# Patient Record
Sex: Male | Born: 1995 | Race: White | Hispanic: No | Marital: Single | Smoking: Never smoker
Health system: Southern US, Community
[De-identification: ages and names within clinical notes are randomized; demographics above are authoritative.]

## PROBLEM LIST (undated history)

## (undated) DIAGNOSIS — T7840XA Allergy, unspecified, initial encounter: Secondary | ICD-10-CM

## (undated) DIAGNOSIS — G43909 Migraine, unspecified, not intractable, without status migrainosus: Secondary | ICD-10-CM

## (undated) DIAGNOSIS — J45909 Unspecified asthma, uncomplicated: Secondary | ICD-10-CM

## (undated) HISTORY — PX: CHOLECYSTECTOMY: SHX55

## (undated) HISTORY — DX: Allergy, unspecified, initial encounter: T78.40XA

## (undated) HISTORY — DX: Unspecified asthma, uncomplicated: J45.909

## (undated) HISTORY — DX: Migraine, unspecified, not intractable, without status migrainosus: G43.909

---
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
## 2015-06-14 ENCOUNTER — Ambulatory Visit: Payer: Self-pay | Admitting: Family Medicine

## 2015-11-10 IMAGING — CT CT HEAD WITHOUT CONTRAST
1 series · 16 of 30 positions shown, 20 images · non-contrast
Comparison: None.

CLINICAL DATA: Left-sided temple pain

EXAM:
CT HEAD WITHOUT CONTRAST
TECHNIQUE: Contiguous axial images were obtained from the base of the skull
through the vertex without intravenous contrast.

[Series 2: head wo · axial · 0.41mm/px · z∈[-172,-44]mm · 16 of 30 slices shown, 20 images]
[im 2/30  brain]
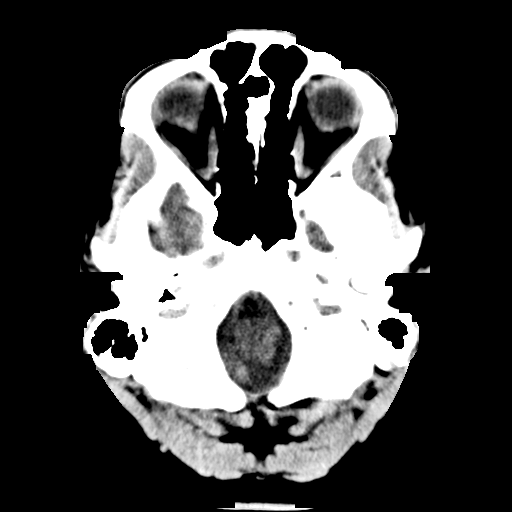
[im 2/30  bone]
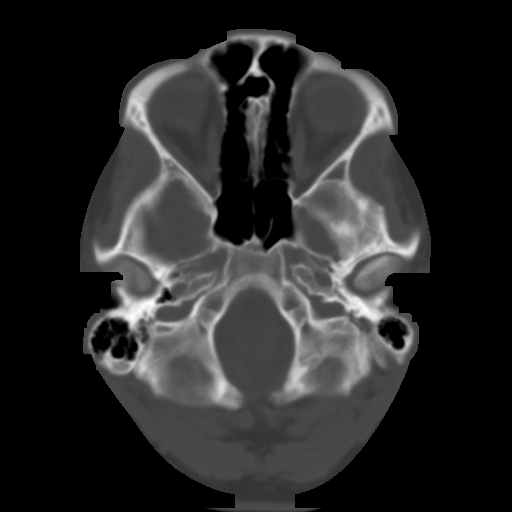
[im 4/30  brain]
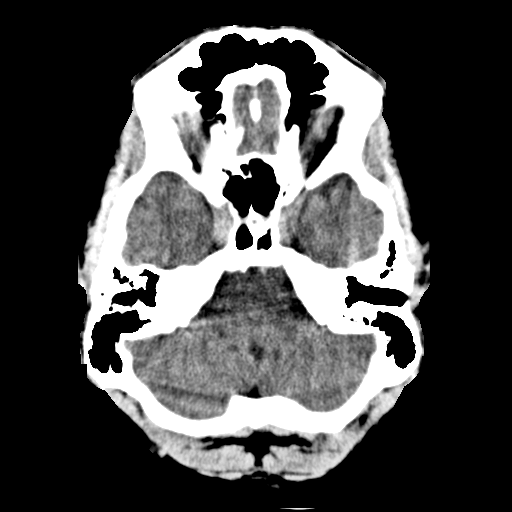
[im 6/30  brain]
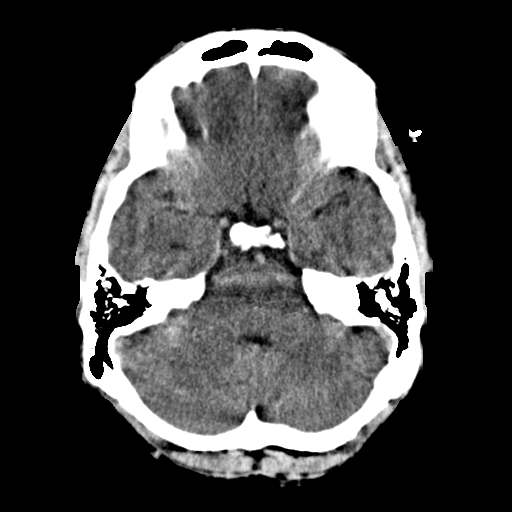
[im 8/30  brain]
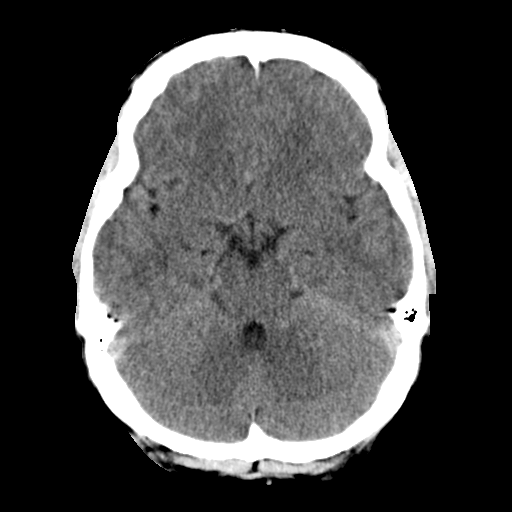
[im 9/30  brain]
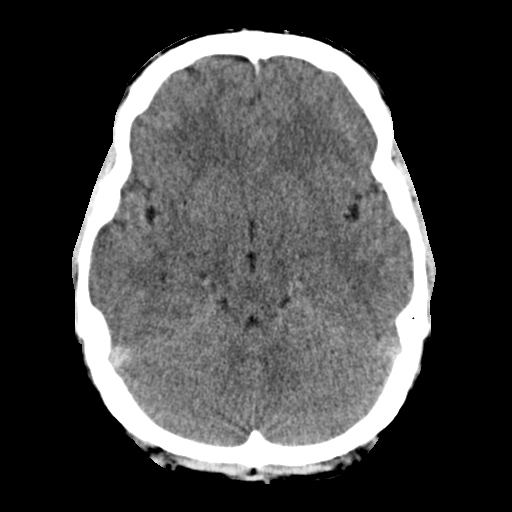
[im 9/30  bone]
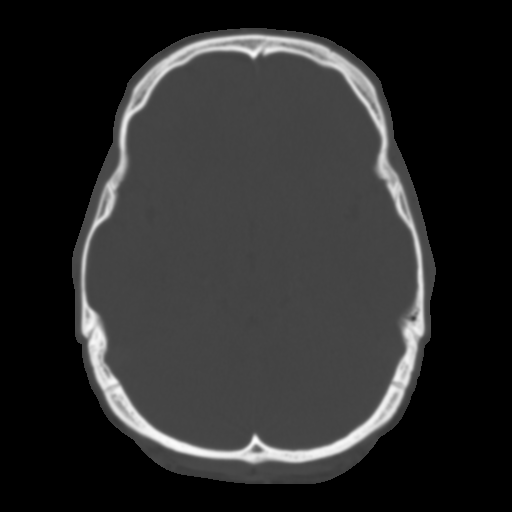
[im 11/30  brain]
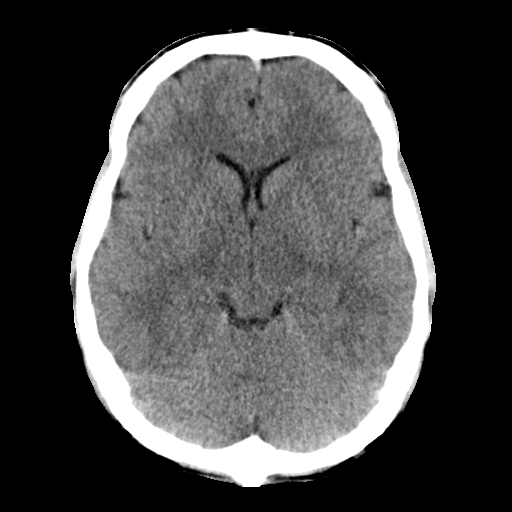
[im 13/30  brain]
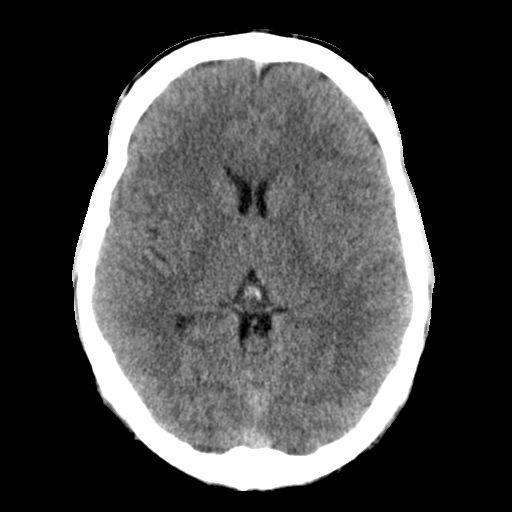
[im 15/30  brain]
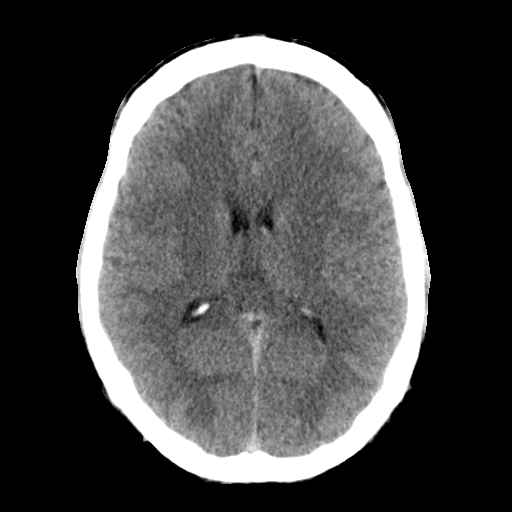
[im 16/30  brain]
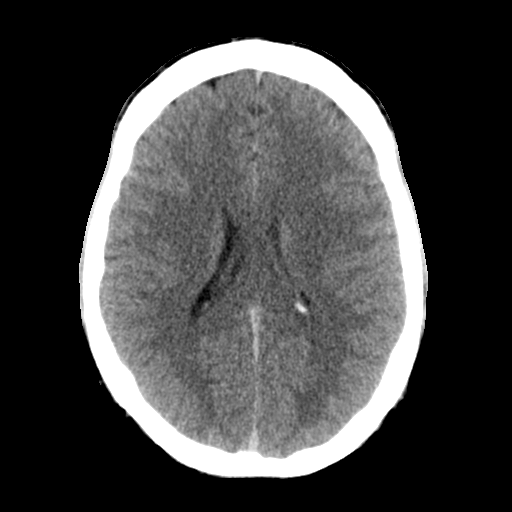
[im 16/30  bone]
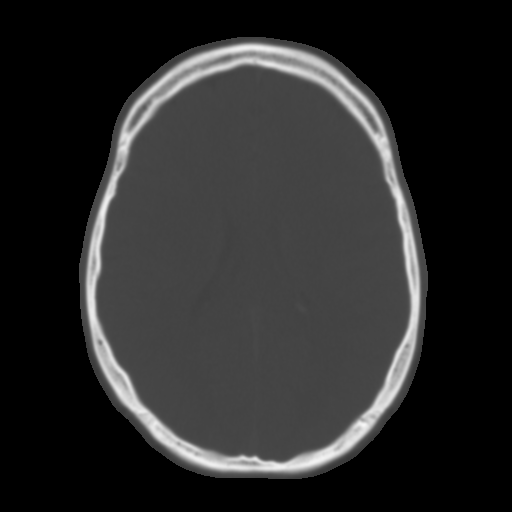
[im 18/30  brain]
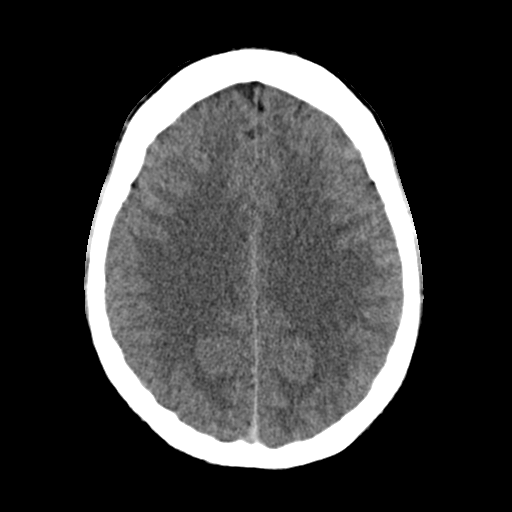
[im 20/30  brain]
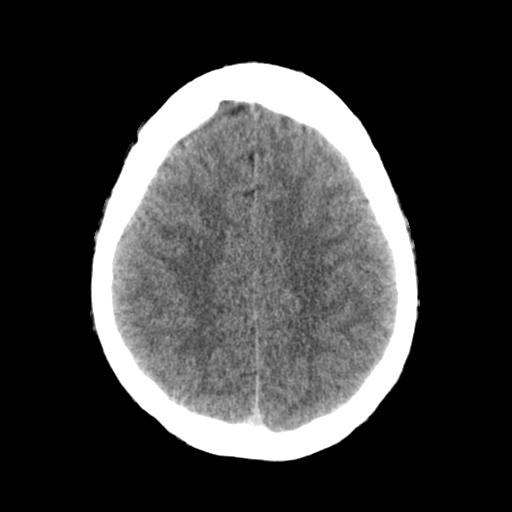
[im 22/30  brain]
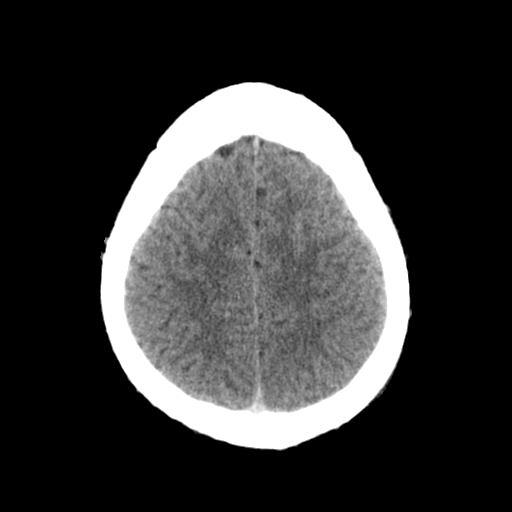
[im 23/30  brain]
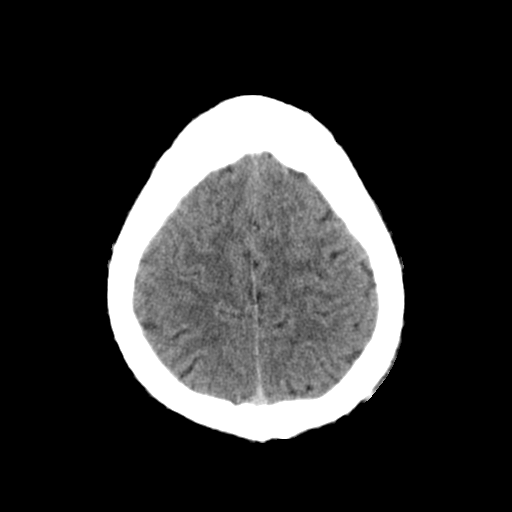
[im 23/30  bone]
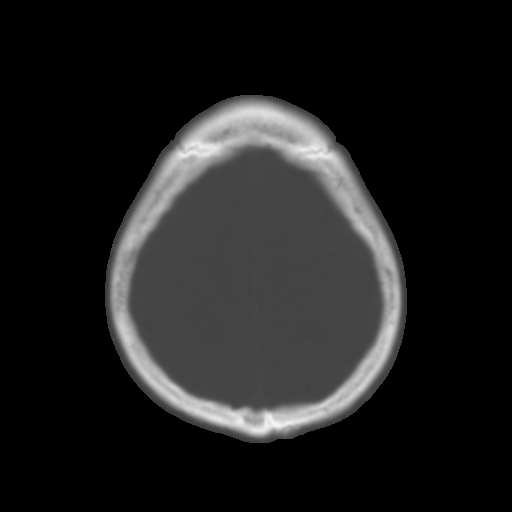
[im 25/30  brain]
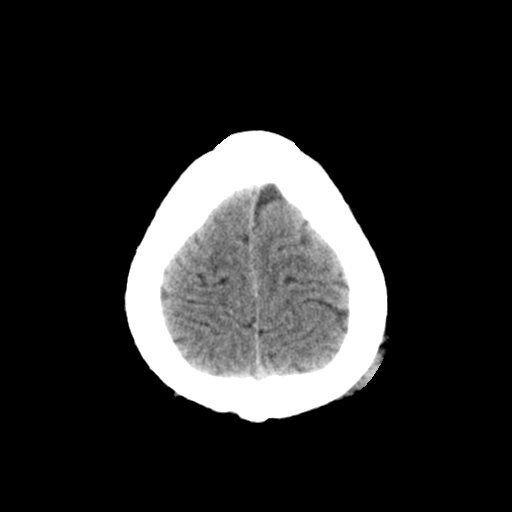
[im 27/30  brain]
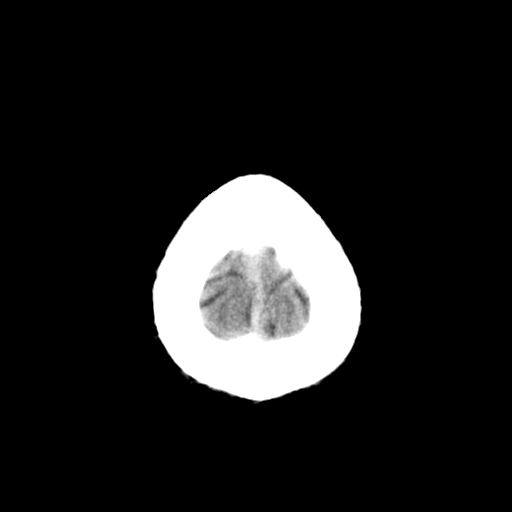
[im 29/30  brain]
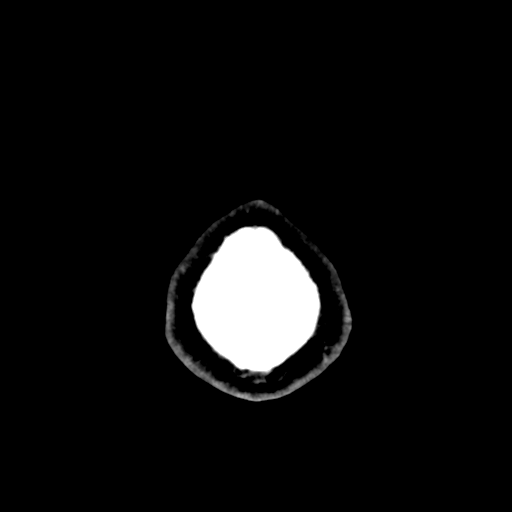

[16 of 30 positions shown; findings below may reference images not displayed]

FINDINGS: There is no evidence of mass effect, midline shift or extra-axial
fluid collections. There is no evidence of a space-occupying lesion
or intracranial hemorrhage. There is no evidence of a cortical-based
area of acute infarction.

The ventricles and sulci are appropriate for the patient's age. The
basal cisterns are patent.

Visualized portions of the orbits are unremarkable. The visualized
portions of the paranasal sinuses and mastoid air cells are
unremarkable.

The osseous structures are unremarkable. There is mild left parietal
scalp soft tissue swelling.
IMPRESSION: No acute intracranial pathology. Mild left parietal scalp soft
tissue swelling.

## 2016-06-30 IMAGING — CR RIGHT ANKLE - COMPLETE 3+ VIEW
1 series · 3 of 3 positions shown · non-contrast
Comparison: 10/07/2010

CLINICAL DATA: Twist injury today while lifting a box, severe ankle
sprain in past, having lateral ankle pain at present

EXAM:
RIGHT ANKLE - COMPLETE 3+ VIEW

[Series 1: dxr ankle right complete · 0.14mm/px · 3 of 3 slices shown]
[im 1/3]
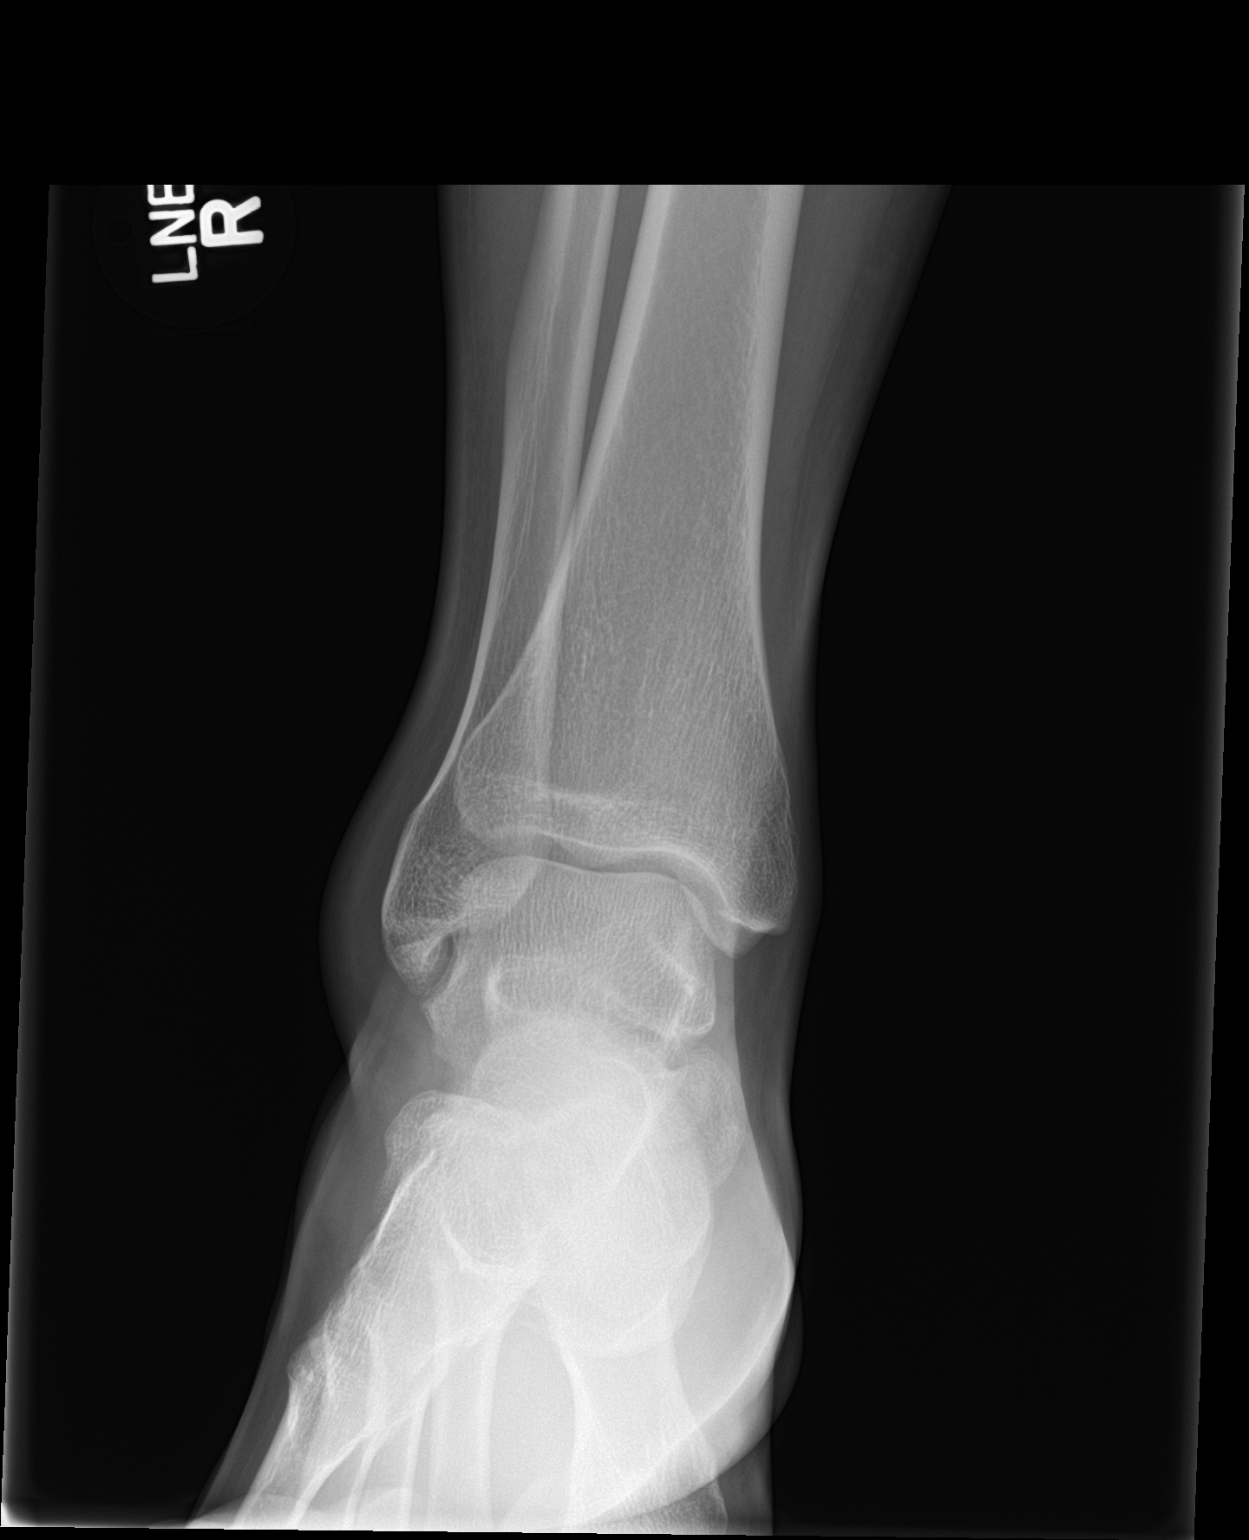
[im 2/3]
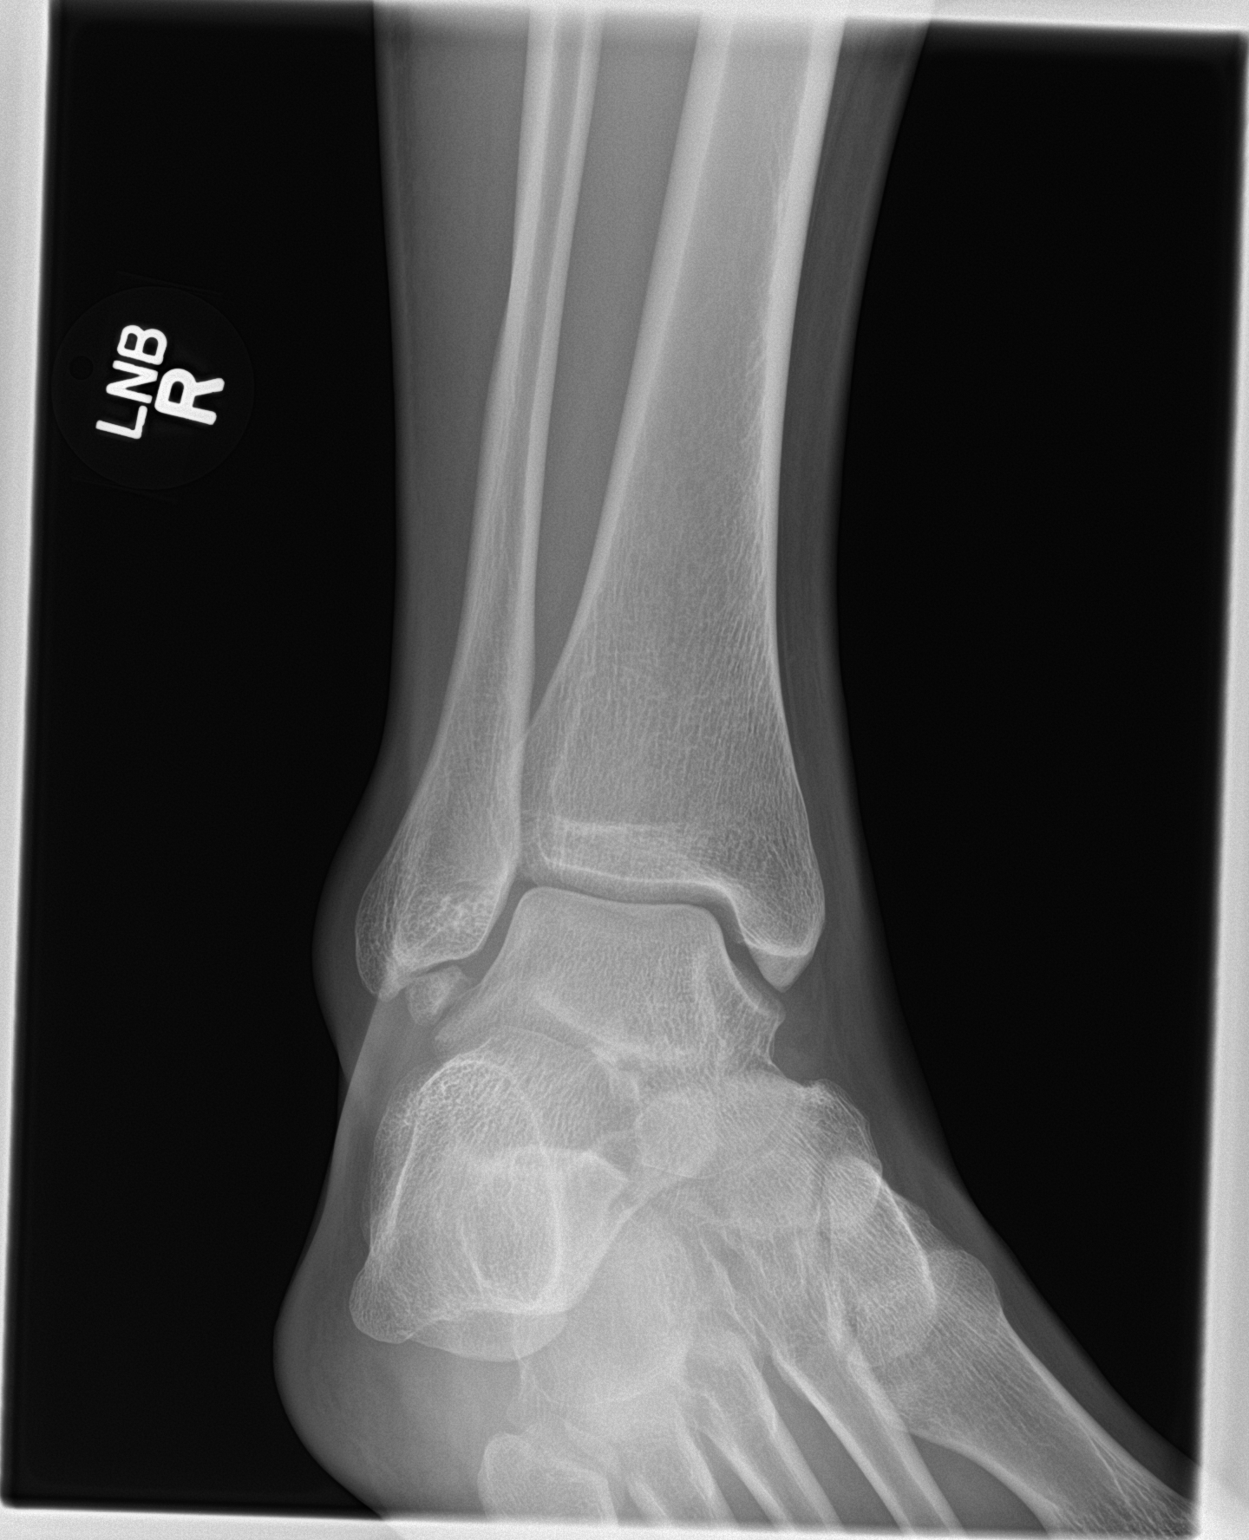
[im 3/3]
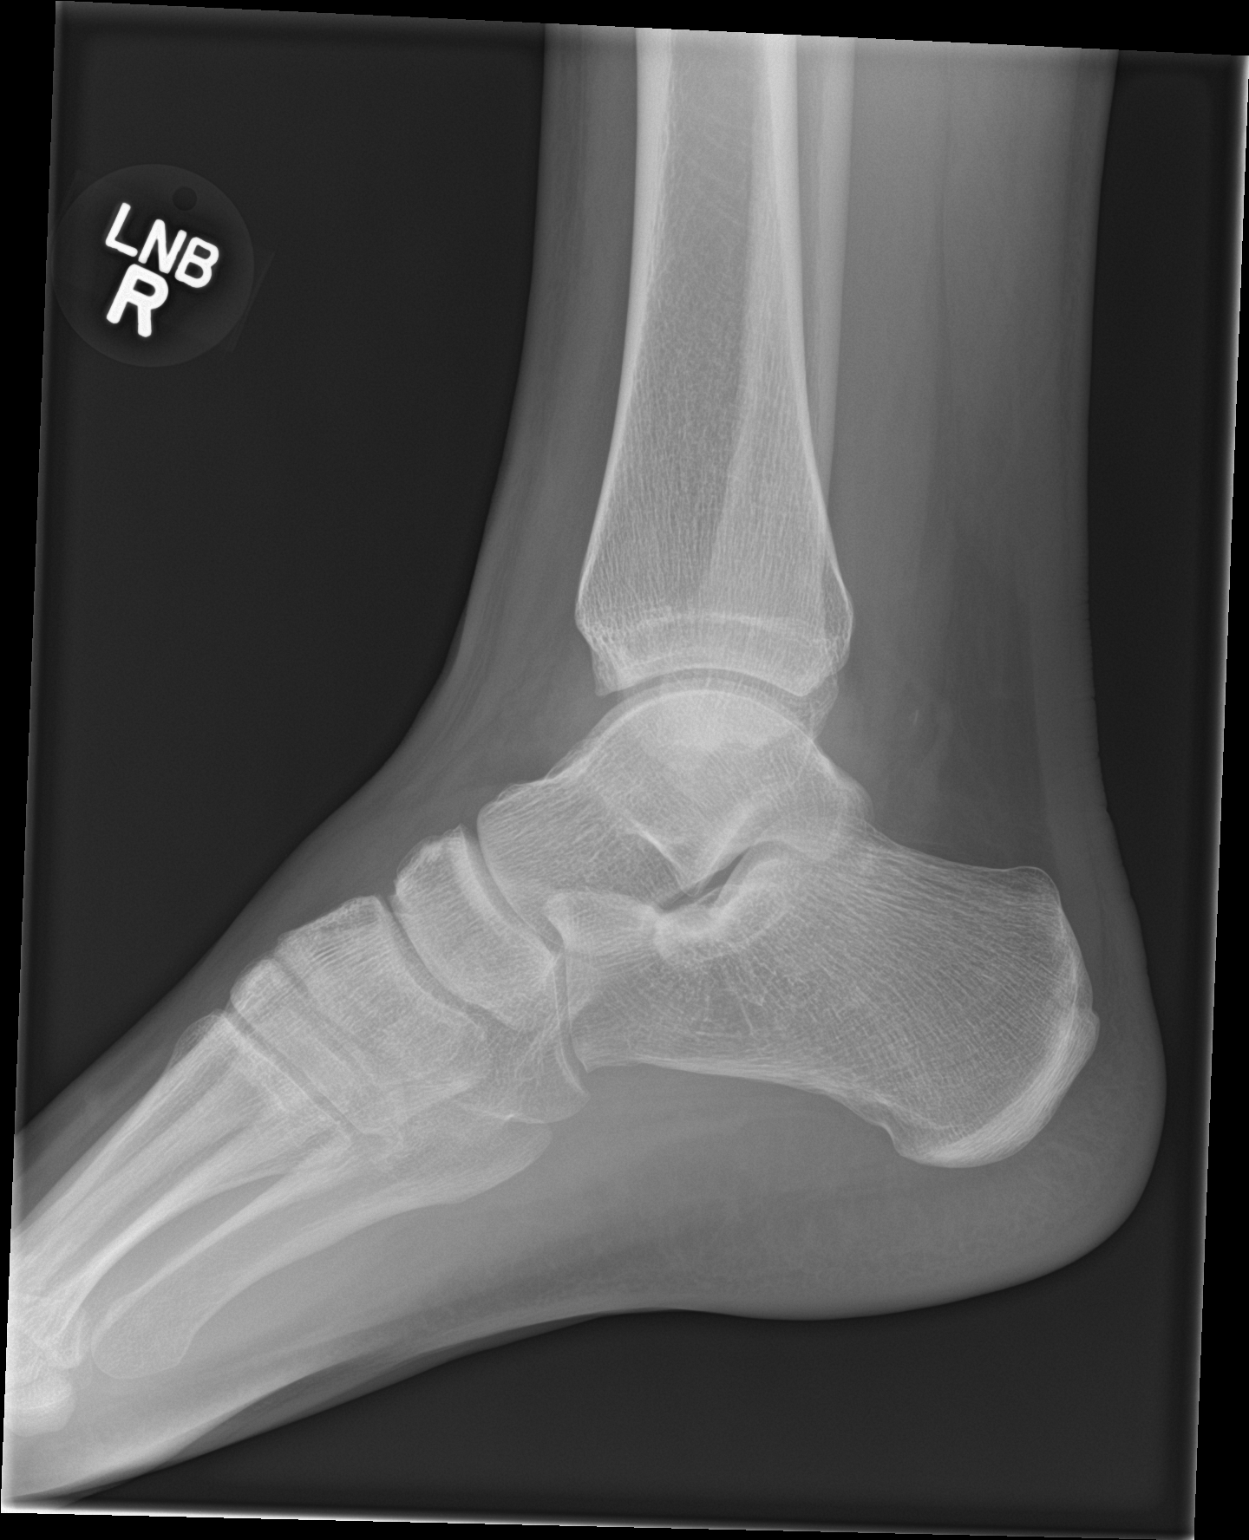

[3 of 3 positions shown; findings below may reference images not displayed]

FINDINGS: Lateral soft tissue swelling.

Ankle mortise intact.

No acute fracture, dislocation or bone destruction.

Old non fused ossicle or avulsion fragment at the lateral malleolus.

No addition loss osseous abnormalities identified.
IMPRESSION: No acute abnormalities.

## 2022-08-10 ENCOUNTER — Ambulatory Visit
Admission: EM | Admit: 2022-08-10 | Discharge: 2022-08-10 | Disposition: A | Discharge status: Home / Self Care | Payer: BC Managed Care – PPO | Attending: Emergency Medicine | Admitting: Emergency Medicine

## 2022-08-10 DIAGNOSIS — H6692 Otitis media, unspecified, left ear: Secondary | ICD-10-CM

## 2022-08-10 MED ORDER — AMOXICILLIN 875 MG PO TABS: 875.0000 mg | ORAL_TABLET | Freq: Two times a day (BID) | ORAL | 0 refills | Status: AC

## 2022-08-10 NOTE — ED Provider Notes (Signed)
Renaldo Fiddler    CSN: [id number] Arrival date & time: 08/10/22  1039      History   Chief Complaint Chief Complaint  Patient presents with   Otalgia    HPI Russell Hendrix is a 26 y.o. male.  Patient presents with 4-day history of bilateral ear pain.  He also reports 1 week history of congestion.  He took Mucinex which helped.  No OTC medications taken today.  He denies fever, ear drainage, sore throat, cough, shortness of breath, or other symptoms.  No pertinent medical history.  The history is provided by the patient and medical records.    History reviewed. No pertinent past medical history.  There are no problems to display for this patient.   Past Surgical History:  Procedure Laterality Date   CHOLECYSTECTOMY         Home Medications    Prior to Admission medications   Medication Sig Start Date End Date Taking? Authorizing Provider  amoxicillin (AMOXIL) 875 MG tablet Take 1 tablet (875 mg total) by mouth 2 (two) times daily for 10 days. 08/10/22 08/20/22 Yes Mickie Bail, NP    Family History No family history on file.  Social History Social History   Tobacco Use   Smoking status: Never   Smokeless tobacco: Never  Vaping Use   Vaping Use: Never used  Substance Use Topics   Alcohol use: Yes   Drug use: Never     Allergies   Patient has no known allergies.   Review of Systems Review of Systems  Constitutional:  Negative for chills and fever.  HENT:  Positive for congestion and ear pain. Negative for sore throat.   Respiratory:  Negative for cough and shortness of breath.   Cardiovascular:  Negative for chest pain and palpitations.  Gastrointestinal:  Negative for abdominal pain, diarrhea and vomiting.  Skin:  Negative for color change and rash.  All other systems reviewed and are negative.    Physical Exam Triage Vital Signs ED Triage Vitals  Enc Vitals Group     BP 08/10/22 1206 130/86     Pulse Rate 08/10/22 1206 86     Resp  08/10/22 1206 18     Temp 08/10/22 1206 98.6 F (37 C)     Temp src --      SpO2 08/10/22 1206 98 %     Weight 08/10/22 1204 240 lb (108.9 kg)     Height 08/10/22 1204 5\' 10"  (1.778 m)     Head Circumference --      Peak Flow --      Pain Score 08/10/22 1203 2     Pain Loc --      Pain Edu? --      Excl. in GC? --    No data found.  Updated Vital Signs BP 130/86   Pulse 86   Temp 98.6 F (37 C)   Resp 18   Ht 5\' 10"  (1.778 m)   Wt 240 lb (108.9 kg)   SpO2 98%   BMI 34.44 kg/m   Visual Acuity Right Eye Distance:   Left Eye Distance:   Bilateral Distance:    Right Eye Near:   Left Eye Near:    Bilateral Near:     Physical Exam Vitals and nursing note reviewed.  Constitutional:      General: He is not in acute distress.    Appearance: He is well-developed. He is not ill-appearing.  HENT:  Head: Normocephalic and atraumatic.     Right Ear: Tympanic membrane and ear canal normal.     Left Ear: Ear canal normal. Tympanic membrane is erythematous.     Nose: Nose normal.     Mouth/Throat:     Mouth: Mucous membranes are moist.     Pharynx: Oropharynx is clear.  Cardiovascular:     Rate and Rhythm: Normal rate and regular rhythm.     Heart sounds: Normal heart sounds.  Pulmonary:     Effort: Pulmonary effort is normal. No respiratory distress.     Breath sounds: Normal breath sounds.  Musculoskeletal:     Cervical back: Neck supple.  Skin:    General: Skin is warm and dry.  Neurological:     Mental Status: He is alert.  Psychiatric:        Mood and Affect: Mood normal.        Behavior: Behavior normal.      UC Treatments / Results  Labs (all labs ordered are listed, but only abnormal results are displayed) Labs Reviewed - No data to display  EKG   Radiology No results found.  Procedures Procedures (including critical care time)  Medications Ordered in UC Medications - No data to display  Initial Impression / Assessment and Plan / UC  Course  I have reviewed the triage vital signs and the nursing notes.  Pertinent labs & imaging results that were available during my care of the patient were reviewed by me and considered in my medical decision making (see chart for details).    Left otitis media.  Treating with amoxicillin.  Discussed symptomatic treatment including Tylenol, rest, hydration.  Instructed patient to follow up with his PCP if symptoms are not improving.  He agrees to plan of care.   Final Clinical Impressions(s) / UC Diagnoses   Final diagnoses:  Left otitis media, unspecified otitis media type     Discharge Instructions      Take the amoxicillin as directed.  Follow up with your primary care provider if your symptoms are not improving.        ED Prescriptions     Medication Sig Dispense Auth. Provider   amoxicillin (AMOXIL) 875 MG tablet Take 1 tablet (875 mg total) by mouth 2 (two) times daily for 10 days. 20 tablet Mickie Bail, NP      PDMP not reviewed this encounter.   Mickie Bail, NP 08/10/22 1241

## 2022-08-10 NOTE — Discharge Instructions (Addendum)
Take the amoxicillin as directed.  Follow up with your primary care provider if your symptoms are not improving.   ° ° °

## 2022-08-10 NOTE — ED Triage Notes (Signed)
Patient to Urgent Care with complaints of bilateral ear pain that started approx four days ago. Denies any drainage. No known fevers.   Taking mucinex/ ibuprofen/ tylenol. Little relief. Reports pain is worse at night and in the morning.  Two negative covid tests at home.

## 2023-09-09 DIAGNOSIS — R109 Unspecified abdominal pain: Secondary | ICD-10-CM | POA: Diagnosis not present

## 2023-09-09 DIAGNOSIS — K529 Noninfective gastroenteritis and colitis, unspecified: Secondary | ICD-10-CM | POA: Diagnosis not present

## 2023-10-05 ENCOUNTER — Encounter: Payer: Self-pay | Admitting: Family Medicine

## 2023-10-05 ENCOUNTER — Telehealth: Payer: Self-pay

## 2023-10-05 ENCOUNTER — Ambulatory Visit (INDEPENDENT_AMBULATORY_CARE_PROVIDER_SITE_OTHER): Payer: BC Managed Care – PPO | Admitting: Family Medicine

## 2023-10-05 VITALS — BP 127/80 | HR 78 | Temp 98.6°F | Ht 70.0 in | Wt 240.0 lb

## 2023-10-05 DIAGNOSIS — Z7689 Persons encountering health services in other specified circumstances: Secondary | ICD-10-CM

## 2023-10-05 DIAGNOSIS — G43109 Migraine with aura, not intractable, without status migrainosus: Secondary | ICD-10-CM

## 2023-10-05 DIAGNOSIS — E6609 Other obesity due to excess calories: Secondary | ICD-10-CM | POA: Insufficient documentation

## 2023-10-05 DIAGNOSIS — R030 Elevated blood-pressure reading, without diagnosis of hypertension: Secondary | ICD-10-CM | POA: Diagnosis not present

## 2023-10-05 DIAGNOSIS — E66811 Obesity, class 1: Secondary | ICD-10-CM

## 2023-10-05 DIAGNOSIS — J4520 Mild intermittent asthma, uncomplicated: Secondary | ICD-10-CM

## 2023-10-05 DIAGNOSIS — F17290 Nicotine dependence, other tobacco product, uncomplicated: Secondary | ICD-10-CM | POA: Insufficient documentation

## 2023-10-05 DIAGNOSIS — Z6834 Body mass index (BMI) 34.0-34.9, adult: Secondary | ICD-10-CM

## 2023-10-05 MED ORDER — PROPRANOLOL HCL 40 MG PO TABS: 40.0000 mg | ORAL_TABLET | Freq: Every day | ORAL | 2 refills | Status: DC

## 2023-10-05 MED ORDER — ALBUTEROL SULFATE HFA 108 (90 BASE) MCG/ACT IN AERS: 2.0000 | INHALATION_SPRAY | Freq: Four times a day (QID) | RESPIRATORY_TRACT | 2 refills | Status: DC | PRN

## 2023-10-05 NOTE — Progress Notes (Signed)
 New Patient Office Visit  Introduced to nurse practitioner role and practice setting.  All questions answered.  Discussed provider/patient relationship and expectations.   Subjective    Patient ID: Russell Hendrix, male    DOB: 06-Mar-1996  Age: 28 y.o. MRN: [id number]  CC:  Chief Complaint  Patient presents with   Establish Care   Migraine    Patient reports being diagnosed in High School.  States that currently he is averaging about 1 per week and it may last from hours to all night.  He takes Excedrin if he is able to early enough to stop it. He had been put on Verapamil but was unable to take it due to side effects.   HPI Russell Hendrix, goes by "Clayborne Artist", presents to establish care with history of migraines and mild asthma who presents to establish care.  He has experienced migraines since high school, approximately 10-12 years ago. The migraines are characterized by aura of floaters preceding the headache, which is localized behind his left eye. He experiences these migraines at least once a week, with durations ranging from a few hours to all night. Triggers include bright lights, heat, and processed foods. He manages the migraines with Excedrin, taken preemptively when he senses an onset. He previously tried verapamil, which was discontinued due to significant hypotension. He describes the pain as 'so bad behind my left eye.' More frequent in summer heat, wears sunglasses at all times to avoid trigger of light. States will get nausea and vomiting helps migraine  He reports asthma symptoms, including random chest tightness and wheezing, which are relieved by using his fiance's albuterol inhaler. These symptoms occur sporadically, often with seasonal changes or physical exertion. No nocturnal coughing is present.  Socially, he consumes alcohol rarely, denies substance use, and vapes intermittently. He drinks one cup of coffee daily and exercises by walking his dog Primary school teacher,  [employer]). He avoids processed foods due to their potential to trigger migraines. No depression or anxiety concerns.  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 [place].  Outpatient Encounter Medications as of 10/05/2023  Medication Sig   albuterol (VENTOLIN HFA) 108 (90 Base) MCG/ACT inhaler Inhale 2 puffs into the lungs every 6 (six) hours as needed for wheezing or shortness of breath.   propranolol (INDERAL) 40 MG tablet Take 1 tablet (40 mg total) by mouth daily.   No facility-administered encounter medications on file as of 10/05/2023.     Past Medical History:  Diagnosis Date   Allergy    Asthma    Migraines     Past Surgical History:  Procedure Laterality Date   CHOLECYSTECTOMY      Family History  Problem Relation Age of Onset   Lupus Mother    Asthma Father     Social History   Socioeconomic History   Marital status: Single    Spouse name: Not on file   Number of children: Not on file   Years of education: Not on file   Highest education level: Bachelor's degree (e.g., BA, AB, BS)  Occupational History   Not on file  Tobacco Use   Smoking status: Some Days    Types: E-cigarettes   Smokeless tobacco: Never  Vaping Use   Vaping status: Some Days  Substance and Sexual Activity   Alcohol use: Yes   Drug use: Never   Sexual activity: Yes    Birth control/protection: None    Comment: fiance  controls  Other Topics Concern   Not on file  Social History Narrative   Not on file   Social Drivers of Health   Financial Resource Strain: Low Risk  (10/02/2023)   Overall Financial Resource Strain (CARDIA)    Difficulty of Paying Living Expenses: Not hard at all  Food Insecurity: No Food Insecurity (10/02/2023)   Hunger Vital Sign    Worried About Running Out of Food in the Last Year: Never true    Ran Out of Food in the Last Year: Never true  Transportation Needs: No Transportation Needs (10/02/2023)   PRAPARE - Therapist, art (Medical): No    Lack of Transportation (Non-Medical): No  Physical Activity: Insufficiently Active (10/02/2023)   Exercise Vital Sign    Days of Exercise per Week: 2 days    Minutes of Exercise per Session: 20 min  Stress: No Stress Concern Present (10/02/2023)   [employer] of Occupational Health - Occupational Stress Questionnaire    Feeling of Stress : Only a little  Social Connections: Socially Integrated (10/02/2023)   Social Connection and Isolation Panel [NHANES]    Frequency of Communication with Friends and Family: More than three times a week    Frequency of Social Gatherings with Friends and Family: Twice a week    Attends Religious Services: More than 4 times per year    Active Member of [employer] or Organizations: Yes    Attends Engineer, structural: More than 4 times per year    Marital Status: Living with partner  Intimate Partner Violence: Not on file    Review of Systems  All other systems reviewed and are negative.     Objective    BP 127/80 (BP Location: Left Arm, Patient Position: Sitting)   Pulse 78   Temp 98.6 F (37 C) (Oral)   Ht 5\' 10"  (1.778 m)   Wt 240 lb (108.9 kg)   SpO2 99%   BMI 34.44 kg/m   Physical Exam Constitutional:      General: He is not in acute distress.    Appearance: Normal appearance. He is not ill-appearing, toxic-appearing or diaphoretic.  HENT:     Head: Normocephalic.     Nose: Nose normal. No congestion.     Mouth/Throat:     Mouth: Mucous membranes are moist.     Pharynx: No oropharyngeal exudate or posterior oropharyngeal erythema.  Eyes:     Extraocular Movements: Extraocular movements intact.     Conjunctiva/sclera: Conjunctivae normal.     Pupils: Pupils are equal, round, and reactive to light.  Neck:     Vascular: No carotid bruit.  Cardiovascular:     Rate and Rhythm: Normal rate and regular rhythm.     Heart sounds: No murmur heard.    No friction rub. No gallop.   Pulmonary:     Effort: Pulmonary effort is normal. No respiratory distress.     Breath sounds: Normal breath sounds. No stridor. No wheezing, rhonchi or rales.  Chest:     Chest wall: No tenderness.  Musculoskeletal:     Right lower leg: No edema.     Left lower leg: No edema.  Lymphadenopathy:     Cervical: No cervical adenopathy.  Skin:    General: Skin is warm and dry.     Capillary Refill: Capillary refill takes less than 2 seconds.  Neurological:     General: No focal deficit present.     Mental Status: He  is alert and oriented to person, place, and time. Mental status is at baseline.     Cranial Nerves: No cranial nerve deficit.     Sensory: No sensory deficit.     Motor: No weakness.     Coordination: Coordination normal.     Gait: Gait normal.  Psychiatric:        Mood and Affect: Mood normal.        Behavior: Behavior normal.        Thought Content: Thought content normal.        Judgment: Judgment normal.         Assessment & Plan:   Migraine with aura and without status migrainosus, not intractable Assessment & Plan: Chronic, weekly migraines with aura, localized behind the left eye.  No change in presentation.  Previous trial of Verapamil was not effective and caused hypotension.  No history of depression or anxiety. -Start Propranolol 40mg  daily for migraine prophylaxis. -Monitor blood pressure and heart rate at home due to potential effects of Propranolol. -Continue use of Excedrin as abortive therapy as needed. -Referral to Neurology for further evaluation given chronicity of migraines.  Orders: -     Propranolol HCl; Take 1 tablet (40 mg total) by mouth daily.  Dispense: 30 tablet; Refill: 2 -     Ambulatory referral to Neurology  Mild intermittent asthma without complication Assessment & Plan: Intermittent chest tightness and wheezing, not associated with cough or nocturnal symptoms.  Triggered by seasonal changes and exercise. Breath sounds  clear on auscultation in visit, no Resp distress, no SOB or DOB. SP02 = 99% -Prescribe Albuterol inhaler for use as needed. - may takes OTC daily antihistamine to help assist in seasonal allergies  Orders: -     Albuterol Sulfate HFA; Inhale 2 puffs into the lungs every 6 (six) hours as needed for wheezing or shortness of breath.  Dispense: 8 g; Refill: 2  Elevated blood pressure reading in office without diagnosis of hypertension Assessment & Plan: Elevated SBP during visit Goal,120/80 Increase weekly exercise - 150 minutes of active, purposeful movement  Well balanced diet, low sodium, continue low processed foods.  Monitor at home with upper arm automatic cuff.    Encounter to establish care with new doctor Assessment & Plan: -Schedule physical exam and labs in 2 months. (CBC, CMP, fasting lipid, TSH, A1c) - pt would like to hold on labs d/t insurance -Recommend Tdap vaccine at time of physical as last dose was in 2014. -Follow up in 1-2 months to assess migraine management.   Class 1 obesity due to excess calories without serious comorbidity with body mass index (BMI) of 34.0 to 34.9 in adult Assessment & Plan: Recommend consistent weekly exercise - 150 minutes of active, purposeful movement Well balanced diet - decrease starches and saturated fats, increase fruits, veggies, proteins   Vaping nicotine dependence, tobacco product Assessment & Plan: Daily to weekly use Pt agreeable to decreasing to stopping Risks discussed     Return in about 2 months (around 12/03/2023) for annual physical, labs, and migraine check.   I, Sallee Provencal, FNP, have reviewed all documentation for this visit. The documentation on 10/05/23 for the exam, diagnosis, procedures, and orders are all accurate and complete.   Sallee Provencal, FNP

## 2023-10-05 NOTE — Assessment & Plan Note (Signed)
 Elevated SBP during visit Goal,120/80 Increase weekly exercise - 150 minutes of active, purposeful movement  Well balanced diet, low sodium, continue low processed foods.  Monitor at home with upper arm automatic cuff.

## 2023-10-05 NOTE — Assessment & Plan Note (Signed)
 Recommend consistent weekly exercise - 150 minutes of active, purposeful movement Well balanced diet - decrease starches and saturated fats, increase fruits, veggies, proteins

## 2023-10-05 NOTE — Telephone Encounter (Unsigned)
 Copied from CRM [id number]. Topic: Clinical - Prescription Issue >> Oct 05, 2023 11:35 AM Geroge Baseman wrote: Reason for CRM: Pharmacy calling to report that the medications sent over for this patient have a pretty significant medical interaction and wanted to advise the office. [facility] [phone], [place].

## 2023-10-05 NOTE — Assessment & Plan Note (Signed)
-  Schedule physical exam and labs in 2 months. (CBC, CMP, fasting lipid, TSH, A1c) - pt would like to hold on labs d/t insurance -Recommend Tdap vaccine at time of physical as last dose was in 2014. -Follow up in 1-2 months to assess migraine management.

## 2023-10-05 NOTE — Assessment & Plan Note (Signed)
 Chronic, weekly migraines with aura, localized behind the left eye.  No change in presentation.  Previous trial of Verapamil was not effective and caused hypotension.  No history of depression or anxiety. -Start Propranolol 40mg  daily for migraine prophylaxis. -Monitor blood pressure and heart rate at home due to potential effects of Propranolol. -Continue use of Excedrin as abortive therapy as needed. -Referral to Neurology for further evaluation given chronicity of migraines.

## 2023-10-05 NOTE — Assessment & Plan Note (Signed)
 Intermittent chest tightness and wheezing, not associated with cough or nocturnal symptoms.  Triggered by seasonal changes and exercise. Breath sounds clear on auscultation in visit, no Resp distress, no SOB or DOB. SP02 = 99% -Prescribe Albuterol inhaler for use as needed. - may takes OTC daily antihistamine to help assist in seasonal allergies

## 2023-10-05 NOTE — Assessment & Plan Note (Signed)
 Daily to weekly use Pt agreeable to decreasing to stopping Risks discussed

## 2023-10-06 ENCOUNTER — Other Ambulatory Visit: Payer: Self-pay | Admitting: Family Medicine

## 2023-10-06 DIAGNOSIS — G43109 Migraine with aura, not intractable, without status migrainosus: Secondary | ICD-10-CM

## 2023-10-06 MED ORDER — VENLAFAXINE HCL ER 37.5 MG PO CP24: 37.5000 mg | ORAL_CAPSULE | Freq: Every day | ORAL | 1 refills | Status: DC

## 2023-10-06 NOTE — Telephone Encounter (Signed)
 Will discontinue propanolol daily for migraine prevention. Start venlafaxine 37.5mg  daily for migraine prevention. Order placed.

## 2023-10-09 ENCOUNTER — Other Ambulatory Visit: Payer: Self-pay | Admitting: Family Medicine

## 2023-10-09 ENCOUNTER — Telehealth: Payer: Self-pay | Admitting: Family Medicine

## 2023-10-09 DIAGNOSIS — G43109 Migraine with aura, not intractable, without status migrainosus: Secondary | ICD-10-CM

## 2023-10-09 MED ORDER — VENLAFAXINE HCL ER 37.5 MG PO CP24: 37.5000 mg | ORAL_CAPSULE | Freq: Every day | ORAL | 1 refills | Status: DC

## 2023-10-09 NOTE — Progress Notes (Signed)
Medication sent to [facility].  

## 2023-10-09 NOTE — Telephone Encounter (Signed)
 Received a fax from [facility] stating that you changed the pt's medication to venlafaxine XR (EFFEXOR XR) 37.5 MG 24 hr capsule.  Looks like it was sent to [facility] instead of [facility].  Please send to [facility].

## 2023-10-09 NOTE — Telephone Encounter (Signed)
 Opened in error

## 2023-12-03 ENCOUNTER — Encounter: Payer: Self-pay | Admitting: Family Medicine

## 2023-12-03 ENCOUNTER — Ambulatory Visit (INDEPENDENT_AMBULATORY_CARE_PROVIDER_SITE_OTHER): Payer: BC Managed Care – PPO | Admitting: Family Medicine

## 2023-12-03 VITALS — BP 144/91 | HR 61 | Resp 16 | Ht 70.0 in | Wt 245.3 lb

## 2023-12-03 DIAGNOSIS — Z0001 Encounter for general adult medical examination with abnormal findings: Secondary | ICD-10-CM

## 2023-12-03 DIAGNOSIS — F17200 Nicotine dependence, unspecified, uncomplicated: Secondary | ICD-10-CM

## 2023-12-03 DIAGNOSIS — Z1159 Encounter for screening for other viral diseases: Secondary | ICD-10-CM

## 2023-12-03 DIAGNOSIS — Z130 Encounter for screening for diseases of the blood and blood-forming organs and certain disorders involving the immune mechanism: Secondary | ICD-10-CM

## 2023-12-03 DIAGNOSIS — Z716 Tobacco abuse counseling: Secondary | ICD-10-CM | POA: Diagnosis not present

## 2023-12-03 DIAGNOSIS — Z114 Encounter for screening for human immunodeficiency virus [HIV]: Secondary | ICD-10-CM

## 2023-12-03 DIAGNOSIS — Z0000 Encounter for general adult medical examination without abnormal findings: Secondary | ICD-10-CM

## 2023-12-03 DIAGNOSIS — Z13220 Encounter for screening for lipoid disorders: Secondary | ICD-10-CM

## 2023-12-03 MED ORDER — VARENICLINE TARTRATE 0.5 MG PO TABS: ORAL_TABLET | ORAL | 0 refills | Status: DC

## 2023-12-03 NOTE — Progress Notes (Signed)
 Complete physical exam   Patient: Russell Hendrix   DOB: 07-03-1996   28 y.o. Male  MRN: [id number] Visit Date: 12/03/2023  Today's healthcare provider: Carlean Charter, DO   Chief Complaint  Patient presents with   Annual Exam    Patient reports doing well overall. Reports migraine symptoms stable.  Declined vaccines today. He will come back for vaccines another day.    Subjective    Russell Hendrix is a 28 y.o. male who presents today for a complete physical exam.  He reports consuming a general diet. The patient does not participate in regular exercise at present, other than playing with his dog outside. He generally feels well. He reports sleeping well. He does not have additional problems to discuss today.   HPI HPI     Annual Exam    Additional comments: Patient reports doing well overall. Reports migraine symptoms stable.  Declined vaccines today. He will come back for vaccines another day.       Last edited by Rosas, Joseline E, CMA on 12/03/2023  9:00 AM.       Russell Hendrix is a 28 year old male with hypertension and migraines who presents for blood pressure management and migraine follow-up.  His blood pressure typically ranges from 125-126/80-85 mmHg, which is in the pre-elevated range. He monitors his blood pressure at home and is not currently on medication for hypertension.  He has experienced improvement in his migraines after starting venlafaxine , which took a couple of weeks to take effect. Initially, there was some confusion at the pharmacy due to its classification as an antidepressant, but it has been effective for migraine prevention.  He is due for a tetanus vaccine, as his last one was in 2008. He has received three COVID-19 vaccines and is eligible for the pneumonia vaccine due to his smoking history.  He is not interested in COVID booster.  He wants to quit smoking and has attempted unsuccessfully to quit cold [place] in the past. He is open to trying  medications like Chantix  or Wellbutrin to aid in smoking cessation.  He reports good sleep and some exercise by playing with his dog outside. He takes a generic Zyrtec for pollen allergies.    Past Medical History:  Diagnosis Date   Allergy    Asthma    Migraines    Past Surgical History:  Procedure Laterality Date   CHOLECYSTECTOMY     Social History   Socioeconomic History   Marital status: Single    Spouse name: Not on file   Number of children: Not on file   Years of education: Not on file   Highest education level: Bachelor's degree (e.g., BA, AB, BS)  Occupational History   Not on file  Tobacco Use   Smoking status: Some Days    Types: E-cigarettes   Smokeless tobacco: Never  Vaping Use   Vaping status: Some Days  Substance and Sexual Activity   Alcohol use: Yes   Drug use: Never   Sexual activity: Yes    Birth control/protection: None    Comment: fiance controls  Other Topics Concern   Not on file  Social History Narrative   Not on file   Social Drivers of Health   Financial Resource Strain: Low Risk  (10/02/2023)   Overall Financial Resource Strain (CARDIA)    Difficulty of Paying Living Expenses: Not hard at all  Food Insecurity: No Food Insecurity (10/02/2023)   Hunger Vital  Sign    Worried About Programme researcher, broadcasting/film/video in the Last Year: Never true    Ran Out of Food in the Last Year: Never true  Transportation Needs: No Transportation Needs (10/02/2023)   PRAPARE - Administrator, Civil Service (Medical): No    Lack of Transportation (Non-Medical): No  Physical Activity: Insufficiently Active (10/02/2023)   Exercise Vital Sign    Days of Exercise per Week: 2 days    Minutes of Exercise per Session: 20 min  Stress: No Stress Concern Present (10/02/2023)   [employer] of Occupational Health - Occupational Stress Questionnaire    Feeling of Stress : Only a little  Social Connections: Socially Integrated (10/02/2023)   Social Connection  and Isolation Panel [NHANES]    Frequency of Communication with Friends and Family: More than three times a week    Frequency of Social Gatherings with Friends and Family: Twice a week    Attends Religious Services: More than 4 times per year    Active Member of [employer] or Organizations: Yes    Attends Engineer, structural: More than 4 times per year    Marital Status: Living with partner  Intimate Partner Violence: Not on file   Family Status  Relation Name Status   Mother  (Not Specified)   Father Terryl Molinelli (Not Specified)  No partnership data on file   Family History  Problem Relation Age of Onset   Lupus Mother    Asthma Father    No Known Allergies  Patient Care Team: Tasia Farr, FNP as PCP - General (Family Medicine)   Medications: Outpatient Medications Prior to Visit  Medication Sig   albuterol  (VENTOLIN  HFA) 108 (90 Base) MCG/ACT inhaler Inhale 2 puffs into the lungs every 6 (six) hours as needed for wheezing or shortness of breath.   cetirizine (ZYRTEC) 10 MG chewable tablet Chew 10 mg by mouth daily.   venlafaxine  XR (EFFEXOR  XR) 37.5 MG 24 hr capsule Take 1 capsule (37.5 mg total) by mouth daily with breakfast.   No facility-administered medications prior to visit.    Review of Systems  Constitutional:  Negative for appetite change, chills, fatigue and fever.  HENT:  Positive for rhinorrhea (r/t allergies). Negative for congestion, ear pain, hearing loss, nosebleeds and trouble swallowing.   Eyes:  Negative for pain and visual disturbance.  Respiratory:  Negative for cough, chest tightness and shortness of breath.   Cardiovascular:  Negative for chest pain, palpitations and leg swelling.  Gastrointestinal:  Negative for abdominal pain, blood in stool, constipation, diarrhea, nausea and vomiting.  Endocrine: Negative for polydipsia, polyphagia and polyuria.  Genitourinary:  Negative for dysuria and flank pain.  Musculoskeletal:  Negative for  arthralgias, back pain, joint swelling, myalgias and neck stiffness.  Skin:  Negative for color change, rash and wound.  Neurological:  Negative for dizziness, tremors, seizures, speech difficulty, weakness, light-headedness and headaches.  Psychiatric/Behavioral:  Negative for behavioral problems, confusion, decreased concentration, dysphoric mood and sleep disturbance. The patient is not nervous/anxious.   All other systems reviewed and are negative.     Objective    BP (!) 144/91 (BP Location: Left Arm, Patient Position: Sitting, Cuff Size: Normal)   Pulse 61   Resp 16   Ht 5\' 10"  (1.778 m)   Wt 245 lb 4.8 oz (111.3 kg)   SpO2 100%   BMI 35.20 kg/m    Physical Exam Vitals and nursing note reviewed.  Constitutional:  General: He is awake.     Appearance: Normal appearance.  HENT:     Head: Normocephalic and atraumatic.     Right Ear: Tympanic membrane, ear canal and external ear normal.     Left Ear: Tympanic membrane, ear canal and external ear normal.     Nose: Nose normal.     Mouth/Throat:     Mouth: Mucous membranes are moist.     Pharynx: Oropharynx is clear. No oropharyngeal exudate or posterior oropharyngeal erythema.  Eyes:     General: No scleral icterus.    Extraocular Movements: Extraocular movements intact.     Conjunctiva/sclera: Conjunctivae normal.     Pupils: Pupils are equal, round, and reactive to light.  Neck:     Thyroid: No thyromegaly or thyroid tenderness.  Cardiovascular:     Rate and Rhythm: Normal rate and regular rhythm.     Pulses: Normal pulses.     Heart sounds: Normal heart sounds.  Pulmonary:     Effort: Pulmonary effort is normal. No tachypnea, bradypnea or respiratory distress.     Breath sounds: Normal breath sounds. No stridor. No wheezing, rhonchi or rales.  Abdominal:     General: Bowel sounds are normal. There is no distension.     Palpations: Abdomen is soft. There is no mass.     Tenderness: There is no abdominal  tenderness. There is no guarding.     Hernia: No hernia is present.  Musculoskeletal:     Cervical back: Normal range of motion and neck supple.     Right lower leg: No edema.     Left lower leg: No edema.  Lymphadenopathy:     Cervical: No cervical adenopathy.  Skin:    General: Skin is warm and dry.  Neurological:     Mental Status: He is alert and oriented to person, place, and time. Mental status is at baseline.  Psychiatric:        Mood and Affect: Mood normal.        Behavior: Behavior normal.      Last depression screening scores    12/03/2023    8:52 AM 10/05/2023    9:27 AM  PHQ 2/9 Scores  PHQ - 2 Score 0 0  PHQ- 9 Score 0 2   Last fall risk screening    12/03/2023    8:51 AM  Fall Risk   Falls in the past year? 0  Number falls in past yr: 0  Injury with Fall? 0  Risk for fall due to : No Fall Risks   Last Audit-C alcohol use screening    10/02/2023    2:31 PM  Alcohol Use Disorder Test (AUDIT)  1. How often do you have a drink containing alcohol? 1  2. How many drinks containing alcohol do you have on a typical day when you are drinking? 0  3. How often do you have six or more drinks on one occasion? 0  AUDIT-C Score 1      Patient-reported   A score of 3 or more in women, and 4 or more in men indicates increased risk for alcohol abuse, EXCEPT if all of the points are from question 1   No results found for any visits on 12/03/23.  Assessment & Plan    Routine Health Maintenance and Physical Exam  Exercise Activities and Dietary recommendations  Goals      Quit Smoking        Immunization History  Administered Date(s) Administered  Dtap, Unspecified 01/19/1996, 03/22/1996, 05/31/1996, 03/28/1997, 10/06/2000   HIB, Unspecified 01/19/1996, 03/22/1996, 05/31/1996, 03/28/1997   Hep B, Unspecified Dec 30, 1995, 12/17/1995, 08/29/1996   Influenza, Seasonal, Injecte, Preservative Fre 04/18/2011   Influenza-Unspecified 06/22/2001, 08/19/2001   MMR  12/16/1996, 10/06/2000   PFIZER(Purple Top)SARS-COV-2 Vaccination 03/03/2020, 03/30/2020   Polio, Unspecified 01/19/1996, 03/22/1996, 12/16/1996, 10/06/2000   Tdap 04/01/2007   Varicella 12/16/1996    Health Maintenance  Topic Date Due   HIV Screening  Never done   Hepatitis C Screening  Never done   DTaP/Tdap/Td (7 - Td or Tdap) 12/28/2023 (Originally 03/31/2017)   Pneumococcal Vaccine 14-24 Years old (1 of 2 - PCV) 12/28/2023 (Originally 11/22/2014)   COVID-19 Vaccine (3 - 2024-25 season) 05/11/2024 (Originally 04/12/2023)   INFLUENZA VACCINE  03/11/2024   HPV VACCINES  Aged Out   Meningococcal B Vaccine  Aged Out    Discussed health benefits of physical activity, and encouraged him to engage in regular exercise appropriate for his age and condition.   Annual physical exam  Screening for lipid disorders -     Lipid panel  Screening for endocrine, metabolic and immunity disorder -     Comprehensive metabolic panel with GFR  Nicotine dependence with current use -     Varenicline  Tartrate; Take 1 tablet (0.5 mg total) by mouth daily for 3 days, THEN 1 tablet (0.5 mg total) 2 (two) times daily for 4 days, THEN 2 tablets (1 mg total) 2 (two) times daily for 21 days.  Dispense: 95 tablet; Refill: 0  Encounter for screening for HIV -     HIV Antibody (routine testing w rflx)  Encounter for hepatitis C screening test for low risk patient -     HCV Ab w Reflex to Quant PCR  Encounter for tobacco use cessation counseling -     Varenicline  Tartrate; Take 1 tablet (0.5 mg total) by mouth daily for 3 days, THEN 1 tablet (0.5 mg total) 2 (two) times daily for 4 days, THEN 2 tablets (1 mg total) 2 (two) times daily for 21 days.  Dispense: 95 tablet; Refill: 0     Annual physical exam Physical exam overall unremarkable except as noted above. Routine lab work ordered as noted. He declined Tdap and pneumonia vaccines today but is aware of the need to update; he will consider them at a  future visit. Discussed smoking cessation options. - Defer Tdap vaccination. - Discuss pneumonia vaccination in the future. - Screen for hepatitis C and HIV.  Tobacco use He expressed a desire to quit smoking and is open to trying Chantix . Discussed potential side effects, including disturbing dreams. - Prescribe Chantix  starter pack. - Provide instructions for Chantix  use.  Hypertension Blood pressure in pre-elevated range, typically 125-126/80-85 mmHg. He is monitoring at home.  Migraine Migraines well-controlled with current medication. Venlafaxine  effective for prevention despite previous side effects.   Return in about 3 months (around 03/03/2024) for Smoking cessation follow-up.     I discussed the assessment and treatment plan with the patient  The patient was provided an opportunity to ask questions and all were answered. The patient agreed with the plan and demonstrated an understanding of the instructions.   The patient was advised to call back or seek an in-person evaluation if the symptoms worsen or if the condition fails to improve as anticipated.    Carlean Charter, DO  [facility] [phone] (phone) [phone] (fax)  [facility]

## 2023-12-04 DIAGNOSIS — Z13228 Encounter for screening for other metabolic disorders: Secondary | ICD-10-CM | POA: Diagnosis not present

## 2023-12-04 DIAGNOSIS — Z13220 Encounter for screening for lipoid disorders: Secondary | ICD-10-CM | POA: Diagnosis not present

## 2023-12-04 DIAGNOSIS — Z114 Encounter for screening for human immunodeficiency virus [HIV]: Secondary | ICD-10-CM | POA: Diagnosis not present

## 2023-12-04 DIAGNOSIS — Z130 Encounter for screening for diseases of the blood and blood-forming organs and certain disorders involving the immune mechanism: Secondary | ICD-10-CM | POA: Diagnosis not present

## 2023-12-04 DIAGNOSIS — Z1159 Encounter for screening for other viral diseases: Secondary | ICD-10-CM | POA: Diagnosis not present

## 2023-12-04 DIAGNOSIS — Z1329 Encounter for screening for other suspected endocrine disorder: Secondary | ICD-10-CM | POA: Diagnosis not present

## 2023-12-05 LAB — LIPID PANEL
Chol/HDL Ratio: 2.9 ratio (ref 0.0–5.0)
Cholesterol, Total: 209 mg/dL — ABNORMAL HIGH (ref 100–199)
HDL: 72 mg/dL (ref >39)
LDL Chol Calc (NIH): 121 mg/dL — ABNORMAL HIGH (ref 0–99)
Triglycerides: 90 mg/dL (ref 0–149)
VLDL Cholesterol Cal: 16 mg/dL (ref 5–40)

## 2023-12-05 LAB — COMPREHENSIVE METABOLIC PANEL WITH GFR
ALT: 32 IU/L (ref 0–44)
AST: 22 IU/L (ref 0–40)
Albumin: 4.8 g/dL (ref 4.3–5.2)
Alkaline Phosphatase: 55 IU/L (ref 44–121)
BUN/Creatinine Ratio: 15 (ref 9–20)
BUN: 15 mg/dL (ref 6–20)
Bilirubin Total: 0.3 mg/dL (ref 0.0–1.2)
CO2: 18 mmol/L — ABNORMAL LOW (ref 20–29)
Calcium: 10.2 mg/dL (ref 8.7–10.2)
Chloride: 102 mmol/L (ref 96–106)
Creatinine, Ser: 1.01 mg/dL (ref 0.76–1.27)
Globulin, Total: 2.8 g/dL (ref 1.5–4.5)
Glucose: 94 mg/dL (ref 70–99)
Potassium: 5 mmol/L (ref 3.5–5.2)
Sodium: 139 mmol/L (ref 134–144)
Total Protein: 7.6 g/dL (ref 6.0–8.5)
eGFR: 104 mL/min/{1.73_m2} (ref >59)

## 2023-12-05 LAB — HCV AB W REFLEX TO QUANT PCR: HCV Ab: NONREACTIVE

## 2023-12-05 LAB — HCV INTERPRETATION

## 2023-12-05 LAB — HIV ANTIBODY (ROUTINE TESTING W REFLEX): HIV Screen 4th Generation wRfx: NONREACTIVE

## 2023-12-14 ENCOUNTER — Encounter: Payer: Self-pay | Admitting: Family Medicine

## 2023-12-14 ENCOUNTER — Telehealth: Payer: Self-pay

## 2023-12-14 DIAGNOSIS — J4520 Mild intermittent asthma, uncomplicated: Secondary | ICD-10-CM

## 2023-12-14 MED ORDER — ALBUTEROL SULFATE HFA 108 (90 BASE) MCG/ACT IN AERS: 2.0000 | INHALATION_SPRAY | Freq: Four times a day (QID) | RESPIRATORY_TRACT | 2 refills | Status: DC | PRN

## 2023-12-14 NOTE — Telephone Encounter (Signed)
 Sent to [facility]

## 2023-12-14 NOTE — Telephone Encounter (Signed)
 Copied from CRM [id number]. Topic: Clinical - Prescription Issue >> Dec 14, 2023  3:47 PM Loreda Rodriguez T wrote: Reason for CRM: patient called stated the med albuterol  (VENTOLIN  HFA) 108 (90 Base) MCG/ACT inhaler was sent to the wrong pharmacy. Should be sent to [facility] - [place], [place] - [id number]  Phone: [phone] Fax: [phone]

## 2024-02-08 ENCOUNTER — Encounter: Payer: Self-pay | Admitting: Family Medicine

## 2024-02-09 ENCOUNTER — Other Ambulatory Visit: Payer: Self-pay | Admitting: Family Medicine

## 2024-02-09 DIAGNOSIS — F17200 Nicotine dependence, unspecified, uncomplicated: Secondary | ICD-10-CM

## 2024-02-09 DIAGNOSIS — Z716 Tobacco abuse counseling: Secondary | ICD-10-CM

## 2024-02-09 MED ORDER — VARENICLINE TARTRATE 0.5 MG PO TABS: ORAL_TABLET | ORAL | 0 refills | Status: AC

## 2024-02-09 MED ORDER — VARENICLINE TARTRATE 1 MG PO TABS
1.0000 mg | ORAL_TABLET | Freq: Two times a day (BID) | ORAL | 3 refills | Status: AC
Start: 2024-03-08 — End: ?

## 2024-06-01 ENCOUNTER — Other Ambulatory Visit: Payer: Self-pay | Admitting: Family Medicine

## 2024-06-01 DIAGNOSIS — J4520 Mild intermittent asthma, uncomplicated: Secondary | ICD-10-CM

## 2024-06-27 ENCOUNTER — Other Ambulatory Visit: Payer: Self-pay | Admitting: Family Medicine

## 2024-06-27 DIAGNOSIS — G43109 Migraine with aura, not intractable, without status migrainosus: Secondary | ICD-10-CM

## 2024-07-25 ENCOUNTER — Encounter

## 2024-07-25 ENCOUNTER — Encounter: Payer: Self-pay | Admitting: Family Medicine
